# Patient Record
Sex: Male | Born: 1965 | Race: White | Hispanic: No | Marital: Married | Smoking: Former smoker
Health system: Southern US, Community
[De-identification: ages and names within clinical notes are randomized; demographics above are authoritative.]

## PROBLEM LIST (undated history)

## (undated) DIAGNOSIS — K219 Gastro-esophageal reflux disease without esophagitis: Secondary | ICD-10-CM

## (undated) DIAGNOSIS — F419 Anxiety disorder, unspecified: Secondary | ICD-10-CM

## (undated) DIAGNOSIS — E785 Hyperlipidemia, unspecified: Secondary | ICD-10-CM

## (undated) HISTORY — PX: NASAL SINUS SURGERY: SHX719

## (undated) HISTORY — DX: Anxiety disorder, unspecified: F41.9

## (undated) HISTORY — DX: Gastro-esophageal reflux disease without esophagitis: K21.9

## (undated) HISTORY — DX: Hyperlipidemia, unspecified: E78.5

---

## 2014-04-01 DIAGNOSIS — R51 Headache: Secondary | ICD-10-CM

## 2014-04-01 DIAGNOSIS — K219 Gastro-esophageal reflux disease without esophagitis: Secondary | ICD-10-CM | POA: Insufficient documentation

## 2014-04-01 DIAGNOSIS — F419 Anxiety disorder, unspecified: Secondary | ICD-10-CM | POA: Insufficient documentation

## 2014-04-01 DIAGNOSIS — E7800 Pure hypercholesterolemia, unspecified: Secondary | ICD-10-CM | POA: Insufficient documentation

## 2014-04-01 DIAGNOSIS — R519 Headache, unspecified: Secondary | ICD-10-CM | POA: Insufficient documentation

## 2015-07-20 ENCOUNTER — Other Ambulatory Visit: Payer: Self-pay | Admitting: Internal Medicine

## 2015-07-20 ENCOUNTER — Ambulatory Visit
Admission: RE | Admit: 2015-07-20 | Discharge: 2015-07-20 | Disposition: A | Discharge status: Home / Self Care | Payer: BLUE CROSS/BLUE SHIELD | Source: Ambulatory Visit | Attending: Family Medicine | Admitting: Family Medicine

## 2015-07-20 ENCOUNTER — Other Ambulatory Visit: Payer: Self-pay | Admitting: Family Medicine

## 2015-07-20 DIAGNOSIS — N509 Disorder of male genital organs, unspecified: Secondary | ICD-10-CM | POA: Diagnosis present

## 2015-07-20 DIAGNOSIS — N433 Hydrocele, unspecified: Secondary | ICD-10-CM | POA: Diagnosis not present

## 2015-07-20 DIAGNOSIS — N5089 Other specified disorders of the male genital organs: Secondary | ICD-10-CM

## 2015-07-20 DIAGNOSIS — N503 Cyst of epididymis: Secondary | ICD-10-CM | POA: Diagnosis not present

## 2015-07-22 ENCOUNTER — Encounter: Payer: Self-pay | Admitting: Urology

## 2015-07-22 ENCOUNTER — Ambulatory Visit (INDEPENDENT_AMBULATORY_CARE_PROVIDER_SITE_OTHER): Payer: BLUE CROSS/BLUE SHIELD | Admitting: Urology

## 2015-07-22 VITALS — BP 122/81 | HR 67 | Ht 74.0 in | Wt 251.1 lb

## 2015-07-22 DIAGNOSIS — K4091 Unilateral inguinal hernia, without obstruction or gangrene, recurrent: Secondary | ICD-10-CM | POA: Diagnosis not present

## 2015-07-22 DIAGNOSIS — K5900 Constipation, unspecified: Secondary | ICD-10-CM

## 2015-07-22 DIAGNOSIS — N433 Hydrocele, unspecified: Secondary | ICD-10-CM | POA: Diagnosis not present

## 2015-07-22 LAB — URINALYSIS, COMPLETE
BILIRUBIN UA: NEGATIVE
Glucose, UA: NEGATIVE
LEUKOCYTES UA: NEGATIVE
NITRITE UA: NEGATIVE
PH UA: 5.5 (ref 5.0–7.5)
Protein, UA: NEGATIVE
RBC UA: NEGATIVE
Specific Gravity, UA: >1.03 — ABNORMAL HIGH (ref 1.005–1.030)
Urobilinogen, Ur: 0.2 mg/dL (ref 0.2–1.0)

## 2015-07-22 LAB — MICROSCOPIC EXAMINATION

## 2015-07-22 LAB — BLADDER SCAN AMB NON-IMAGING: SCAN RESULT: 27
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
## 2015-07-22 NOTE — Progress Notes (Signed)
Bladder Scan Patient void: 24ml Performed By: K.Nannette Zill,CMA 

## 2015-07-25 ENCOUNTER — Encounter: Payer: Self-pay | Admitting: *Deleted

## 2015-07-25 ENCOUNTER — Encounter: Payer: Self-pay | Admitting: Urology

## 2015-08-04 ENCOUNTER — Encounter: Payer: Self-pay | Admitting: General Surgery

## 2015-08-04 ENCOUNTER — Ambulatory Visit (INDEPENDENT_AMBULATORY_CARE_PROVIDER_SITE_OTHER): Payer: BLUE CROSS/BLUE SHIELD | Admitting: General Surgery

## 2015-08-04 VITALS — BP 140/80 | HR 78 | Resp 14 | Ht 72.0 in | Wt 254.0 lb

## 2015-08-04 DIAGNOSIS — K4090 Unilateral inguinal hernia, without obstruction or gangrene, not specified as recurrent: Secondary | ICD-10-CM | POA: Diagnosis not present

## 2015-08-04 NOTE — Patient Instructions (Signed)
Hernia, Adult A hernia is the bulging of an organ or tissue through a weak spot in the muscles of the abdomen (abdominal wall). Hernias develop most often near the navel or groin. There are many kinds of hernias. Common kinds include:  Femoral hernia. This kind of hernia develops under the groin in the upper thigh area.  Inguinal hernia. This kind of hernia develops in the groin or scrotum.  Umbilical hernia. This kind of hernia develops near the navel.  Hiatal hernia. This kind of hernia causes part of the stomach to be pushed up into the chest.  Incisional hernia. This kind of hernia bulges through a scar from an abdominal surgery. CAUSES This condition may be caused by:  Heavy lifting.  Coughing over a long period of time.  Straining to have a bowel movement.  An incision made during an abdominal surgery.  A birth defect (congenital defect).  Excess weight or obesity.  Smoking.  Poor nutrition.  Cystic fibrosis.  Excess fluid in the abdomen.  Undescended testicles. SYMPTOMS Symptoms of a hernia include:  A lump on the abdomen. This is the first sign of a hernia. The lump may become more obvious with standing, straining, or coughing. It may get bigger over time if it is not treated or if the condition causing it is not treated.  Pain. A hernia is usually painless, but it may become painful over time if treatment is delayed. The pain is usually dull and may get worse with standing or lifting heavy objects. Sometimes a hernia gets tightly squeezed in the weak spot (strangulated) or stuck there (incarcerated) and causes additional symptoms. These symptoms may include:  Vomiting.  Nausea.  Constipation.  Irritability. DIAGNOSIS A hernia may be diagnosed with:  A physical exam. During the exam your health care provider may ask you to cough or to make a specific movement, because a hernia is usually more visible when you move.  Imaging tests. These can  include:  X-rays.  Ultrasound.  CT scan. TREATMENT A hernia that is small and painless may not need to be treated. A hernia that is large or painful may be treated with surgery. Inguinal hernias may be treated with surgery to prevent incarceration or strangulation. Strangulated hernias are always treated with surgery, because lack of blood to the trapped organ or tissue can cause it to die. Surgery to treat a hernia involves pushing the bulge back into place and repairing the weak part of the abdomen. HOME CARE INSTRUCTIONS  Avoid straining.  Do not lift anything heavier than 10 lb (4.5 kg).  Lift with your leg muscles, not your back muscles. This helps avoid strain.  When coughing, try to cough gently.  Prevent constipation. Constipation leads to straining with bowel movements, which can make a hernia worse or cause a hernia repair to break down. You can prevent constipation by:  Eating a high-fiber diet that includes plenty of fruits and vegetables.  Drinking enough fluids to keep your urine clear or pale yellow. Aim to drink 6-8 glasses of water per day.  Using a stool softener as directed by your health care provider.  Lose weight, if you are overweight.  Do not use any tobacco products, including cigarettes, chewing tobacco, or electronic cigarettes. If you need help quitting, ask your health care provider.  Keep all follow-up visits as directed by your health care provider. This is important. Your health care provider may need to monitor your condition. SEEK MEDICAL CARE IF:  You have   swelling, redness, and pain in the affected area.  Your bowel habits change. SEEK IMMEDIATE MEDICAL CARE IF:  You have a fever.  You have abdominal pain that is getting worse.  You feel nauseous or you vomit.  You cannot push the hernia back in place by gently pressing on it while you are lying down.  The hernia:  Changes in shape or size.  Is stuck outside the  abdomen.  Becomes discolored.  Feels hard or tender.   This information is not intended to replace advice given to you by your health care provider. Make sure you discuss any questions you have with your health care provider.   Document Released: 04/09/2005 Document Revised: 04/30/2014 Document Reviewed: 02/17/2014 Elsevier Interactive Patient Education 2016 [employer]  

## 2015-08-04 NOTE — Progress Notes (Signed)
Patient ID: Jared Gross, male   DOB: Jan 13, 1966, 50 y.o.   MRN: [id number]  Chief Complaint  Patient presents with  . Other    right inguinal hernia    HPI Jared Gross is a 50 y.o. male here today for a evaluation of a right inguinal hernia. The patient recently underwent evaluation with the urology service for left testicular swelling. As part of that evaluation ultrasound was completed. The patient was not aware of any abnormalities in the right groin prior to his recent urology visit.   HPI  Past Medical History  Diagnosis Date  . Acid reflux   . Anxiety   . Hyperlipidemia     Past Surgical History  Procedure Laterality Date  . Nasal sinus surgery      Family History  Problem Relation Age of Onset  . Hematuria Neg Hx   . Kidney cancer Neg Hx     Social History Social History  Substance Use Topics  . Smoking status: Former Smoker    Quit date: 07/22/1995  . Smokeless tobacco: None  . Alcohol Use: 0.0 oz/week    0 Standard drinks or equivalent per week    Allergies  Allergen Reactions  . Hydrocodone-Acetaminophen Nausea And Vomiting    Current Outpatient Prescriptions  Medication Sig Dispense Refill  . simvastatin (ZOCOR) 10 MG tablet Take by mouth. Reported on 08/04/2015     No current facility-administered medications for this visit.    Review of Systems Review of Systems  Constitutional: Negative.   Respiratory: Negative.   Cardiovascular: Negative.     Blood pressure 140/80, pulse 78, resp. rate 14, height 6' (1.829 m), weight 254 lb (115.214 kg).  Physical Exam Physical Exam  Constitutional: He is oriented to person, place, and time. He appears well-developed and well-nourished.  Eyes: Conjunctivae are normal. No scleral icterus.  Neck: Neck supple.  Cardiovascular: Normal rate, regular rhythm and normal heart sounds.   Pulmonary/Chest: Effort normal and breath sounds normal.  Abdominal: Soft. Normal appearance and bowel sounds are normal.  There is no tenderness. A hernia is present. Hernia confirmed positive in the right inguinal area.  Genitourinary:    Left testis shows swelling (the left hemiscrotum is proximally twice as large as the right.).  Lymphadenopathy:    He has no cervical adenopathy.  Neurological: He is alert and oriented to person, place, and time.  Skin: Skin is warm and dry.    Data Reviewed CLINICAL DATA: Swelling of the left testicle for 1 week. Evaluate for groin hernia.  EXAM: ULTRASOUND PELVIS LIMITED  TECHNIQUE: Ultrasound examination of the LEFT GROIN was performed in the area of clinical concern.  COMPARISON: None  FINDINGS: Left groin grayscale imaging with Valsalva shows mobile appearing fat which could reflect a fatty inguinal hernia. No bowel seen in the groin. Dedicated imaging of the scrotal contents reported separately.  IMPRESSION: Mobile fat in the left groin may reflect fatty inguinal hernia. No evidence of herniated bowel.  CLINICAL DATA: Left testicular swelling  EXAM: SCROTAL ULTRASOUND  DOPPLER ULTRASOUND OF THE TESTICLES  TECHNIQUE: Complete ultrasound examination of the testicles, epididymis, and other scrotal structures was performed. Color and spectral Doppler ultrasound were also utilized to evaluate blood flow to the testicles.  COMPARISON: None.  FINDINGS: Right testicle  Measurements: 3.3 x 2.3 x 3.6 cm. No mass or microlithiasis visualized.  Left testicle  Measurements: 4.7 x 2.6 x 3.2 cm. No mass or microlithiasis visualized.  Right epididymis: Small complex cyst measuring  3 mm is noted.  Left epididymis: Normal in size and appearance.  Hydrocele: Small bilateral hydroceles.  Varicocele: Mild varicoceles are noted bilaterally as well.  Pulsed Doppler interrogation of both testes demonstrates normal low resistance arterial and venous waveforms bilaterally.  IMPRESSION: The testicles appear within normal  limits.  Small right epididymal cyst.  Bilateral hydroceles and varicoceles.   Electronically Signed  By: Inez Catalina M.D.  On: 07/20/2015 14:32  Urology evaluation with Nicolette Bang, M.D. from [facility] dated 07/22/2015 reviewed. History obtained in that office reports traumatic injury to the left scrotum one week prior to evaluation and impression is of a traumatic hydrocele not requiring operative intervention. No significant asymmetry described during that exam.  Assessment    Right inguinal hernia.  Left hemiscrotum swelling    Plan    The patient will benefit from repeat urology assessment considering the asymmetry in the hemiscrotum noticed on today's exam and the patient's reported increasing discomfort since his urology assessment. The ultrasound finding of multiple fat within the inguinal canal was not enough to warrant operative intervention on the left side.      Hernia precautions and incarceration were discussed with the patient. If they develop symptoms of an incarcerated hernia, they were encouraged to seek prompt medical attention.  I have recommended repair of the hernia using mesh on an outpatient basis in the near future. The risk of infection was reviewed. The role of prosthetic mesh to minimize the risk of recurrence was reviewed.  PCP:  Sherrin Daisy This information has been scribed by Gaspar Cola CMA.    Robert Bellow 08/05/2015, 5:24 PM

## 2015-08-05 DIAGNOSIS — K4090 Unilateral inguinal hernia, without obstruction or gangrene, not specified as recurrent: Secondary | ICD-10-CM | POA: Insufficient documentation

## 2015-08-08 ENCOUNTER — Telehealth: Payer: Self-pay | Admitting: *Deleted

## 2015-08-08 NOTE — Telephone Encounter (Signed)
I spoke with Amy, the surgery scheduler for [place], she states that patient presently has an appointment with Dr. Noah Delaine for 08-19-15 at 11 am.   Patient is aware of appointment and will keep as scheduled. We will await to here from [place] before scheduling a surgery date.

## 2015-08-08 NOTE — Telephone Encounter (Signed)
-----   Message from Robert Bellow, MD sent at 08/05/2015  5:26 PM EDT ----- Looking at the note from urology unknown see that they were planning on any surgical intervention. I think the patient needs to be reassessed by Dr. Noah Delaine prior to our involvement with repair of the contralateral hernia considering the patient's increasing discomfort and swelling. Thank you

## 2015-08-19 ENCOUNTER — Ambulatory Visit (INDEPENDENT_AMBULATORY_CARE_PROVIDER_SITE_OTHER): Payer: BLUE CROSS/BLUE SHIELD | Admitting: Urology

## 2015-08-19 VITALS — BP 125/81 | HR 76 | Resp 16 | Wt 255.0 lb

## 2015-08-19 DIAGNOSIS — N433 Hydrocele, unspecified: Secondary | ICD-10-CM | POA: Diagnosis not present

## 2015-08-19 LAB — URINALYSIS, COMPLETE
Bilirubin, UA: NEGATIVE
GLUCOSE, UA: NEGATIVE
Ketones, UA: NEGATIVE
Leukocytes, UA: NEGATIVE
Nitrite, UA: NEGATIVE
PROTEIN UA: NEGATIVE
RBC, UA: NEGATIVE
Specific Gravity, UA: 1.025 (ref 1.005–1.030)
Urobilinogen, Ur: 0.2 mg/dL (ref 0.2–1.0)
pH, UA: 5.5 (ref 5.0–7.5)

## 2015-08-19 LAB — MICROSCOPIC EXAMINATION
BACTERIA UA: NONE SEEN
EPITHELIAL CELLS (NON RENAL): NONE SEEN /HPF (ref 0–10)

## 2015-08-19 NOTE — Progress Notes (Signed)
08/19/2015 11:13 AM   Jared Gross [birth year] [id number]  Referring provider: Sherrin Daisy, MD [place], [place] [id number]  Chief Complaint  Patient presents with  . Hydrocele    HPI: Jared Gross is a 50yo here for followup for LUTS and left hydrocele. He has mild left testicular pain which is worse with lifting. He has seen general surgery and is pursuing right inguinal hernia repair.  He continues to nocturia 2-3x, mild urgency which does not bother him. He drinks green tea prior to bedtime.  No OSA   PMH: Past Medical History  Diagnosis Date  . Acid reflux   . Anxiety   . Hyperlipidemia     Surgical History: Past Surgical History  Procedure Laterality Date  . Nasal sinus surgery      Home Medications:    Medication List       This list is accurate as of: 08/19/15 11:13 AM.  Always use your most recent med list.               simvastatin 10 MG tablet  Commonly known as:  ZOCOR  Take by mouth. Reported on 08/19/2015        Allergies:  Allergies  Allergen Reactions  . Hydrocodone-Acetaminophen Nausea And Vomiting    Family History: Family History  Problem Relation Age of Onset  . Hematuria Neg Hx   . Kidney cancer Neg Hx     Social History:  reports that he quit smoking about 20 years ago. He does not have any smokeless tobacco history on file. He reports that he drinks alcohol. He reports that he does not use illicit drugs.  ROS: UROLOGY Frequent Urination?: Yes Hard to postpone urination?: Yes Burning/pain with urination?: No Get up at night to urinate?: Yes Leakage of urine?: No Urine stream starts and stops?: No Trouble starting stream?: No Do you have to strain to urinate?: No Blood in urine?: No Urinary tract infection?: No Sexually transmitted disease?: No Injury to kidneys or bladder?: No Painful intercourse?: No Weak stream?: No Erection problems?: No Penile pain?: No  Gastrointestinal Nausea?:  No Vomiting?: No Indigestion/heartburn?: No Diarrhea?: No Constipation?: No  Constitutional Fever: No Night sweats?: No Weight loss?: No Fatigue?: No  Skin Skin rash/lesions?: No Itching?: No  Eyes Blurred vision?: No Double vision?: No  Ears/Nose/Throat Sore throat?: No Sinus problems?: No  Hematologic/Lymphatic Swollen glands?: No Easy bruising?: No  Cardiovascular Leg swelling?: No Chest pain?: No  Respiratory Cough?: No Shortness of breath?: No  Endocrine Excessive thirst?: No  Musculoskeletal Back pain?: No Joint pain?: No  Neurological Headaches?: No Dizziness?: No  Psychologic Depression?: No Anxiety?: No  Physical Exam: BP 125/81 mmHg  Pulse 76  Resp 16  Wt 115.667 kg (255 lb)  Constitutional:  Alert and oriented, No acute distress. HEENT: [place] AT, moist mucus membranes.  Trachea midline, no masses. Cardiovascular: No clubbing, cyanosis, or edema. Respiratory: Normal respiratory effort, no increased work of breathing. GI: Abdomen is soft, nontender, nondistended, no abdominal masses GU: No CVA tenderness.  Skin: No rashes, bruises or suspicious lesions. Lymph: No cervical or inguinal adenopathy. Neurologic: Grossly intact, no focal deficits, moving all 4 extremities. Psychiatric: Normal mood and affect.  Laboratory Data: No results found for: WBC, HGB, HCT, MCV, PLT  No results found for: CREATININE  No results found for: PSA  No results found for: TESTOSTERONE  No results found for: HGBA1C  Urinalysis    Component Value Date/Time   APPEARANCEUR  Cloudy* 07/22/2015 1142   GLUCOSEU Negative 07/22/2015 1142   BILIRUBINUR Negative 07/22/2015 1142   PROTEINUR Negative 07/22/2015 1142   NITRITE Negative 07/22/2015 1142   LEUKOCYTESUR Negative 07/22/2015 1142    Pertinent Imaging: scrotal US  Assessment & Plan:    1. Hydrocele in adult -schedule for left hydrocelectomy in combination with right inguinal hernia repair. -  Urinalysis, Complete  2. BPH with LUTS -Pt defers BPH treatment at this time   No Follow-up on file.  Nicolette Bang, MD  [facility] [address] [place], [place] [address] [phone]

## 2015-08-25 ENCOUNTER — Telehealth: Payer: Self-pay | Admitting: Radiology

## 2015-08-25 NOTE — Telephone Encounter (Signed)
No answer & unable to leave message. Need to notify pt of surgery scheduled 10/14/15.

## 2015-08-30 NOTE — Telephone Encounter (Signed)
LMOM

## 2015-09-01 NOTE — Telephone Encounter (Signed)
LMOM

## 2015-09-26 ENCOUNTER — Encounter: Payer: Self-pay | Admitting: *Deleted

## 2015-10-03 ENCOUNTER — Encounter: Payer: Self-pay | Admitting: General Surgery

## 2015-10-03 ENCOUNTER — Ambulatory Visit (INDEPENDENT_AMBULATORY_CARE_PROVIDER_SITE_OTHER): Payer: BLUE CROSS/BLUE SHIELD | Admitting: General Surgery

## 2015-10-03 VITALS — BP 124/76 | HR 76 | Resp 12 | Ht 72.0 in | Wt 252.0 lb

## 2015-10-03 DIAGNOSIS — K4090 Unilateral inguinal hernia, without obstruction or gangrene, not specified as recurrent: Secondary | ICD-10-CM

## 2015-10-03 NOTE — Patient Instructions (Signed)

## 2015-10-03 NOTE — Progress Notes (Signed)
Patient ID: Jared Gross, male   DOB: [birth year], 50 y.o.   MRN: [id number]  Chief Complaint  Patient presents with  . Pre-op Exam    right inguinal hernia    HPI Jared Gross is a 50 y.o. male here today for his pre op right inguinal hernia repair scheduled for 10/14/15. A left hydrocele will be resected at the same setting by the urology service.  I personally reviewed the patient's history. HPI  Past Medical History  Diagnosis Date  . Acid reflux   . Anxiety   . Hyperlipidemia     Past Surgical History  Procedure Laterality Date  . Nasal sinus surgery      Family History  Problem Relation Age of Onset  . Hematuria Neg Hx   . Kidney cancer Neg Hx     Social History Social History  Substance Use Topics  . Smoking status: Former Smoker    Quit date: 07/22/1995  . Smokeless tobacco: None  . Alcohol Use: 0.0 oz/week    0 Standard drinks or equivalent per week    Allergies  Allergen Reactions  . Hydrocodone-Acetaminophen Nausea And Vomiting    Current Outpatient Prescriptions  Medication Sig Dispense Refill  . Aspirin-Salicylamide-Caffeine (ARTHRITIS STRENGTH BC POWDER PO) Take 1 packet by mouth daily.     No current facility-administered medications for this visit.    Review of Systems Review of Systems  Constitutional: Negative.   Respiratory: Negative.   Cardiovascular: Negative.     Blood pressure 124/76, pulse 76, resp. rate 12, height 6' (1.829 m), weight 252 lb (114.306 kg).  Physical Exam Physical Exam  Constitutional: He is oriented to person, place, and time. He appears well-developed and well-nourished.  Eyes: Conjunctivae are normal. No scleral icterus.  Neck: Neck supple.  Cardiovascular: Normal rate, regular rhythm and normal heart sounds.   Pulmonary/Chest: Effort normal and breath sounds normal.  Abdominal: Soft. Bowel sounds are normal. There is no hepatomegaly. There is no tenderness. A hernia is present. Hernia confirmed positive  in the right inguinal area.  Genitourinary:     Lymphadenopathy:    He has no cervical adenopathy.  Neurological: He is alert and oriented to person, place, and time.  Skin: Skin is warm and dry.      Assessment     Symptomatic right inguinal hernia.     Plan    The patient has been encouraged to get a scrotal support or jockstrap for postoperative comfort. He has been advised by the urologist that a drain will be placed at the time of surgery.    Hernia precautions and incarceration were discussed with the patient. If they develop symptoms of an incarcerated hernia, they were encouraged to seek prompt medical attention.  I have recommended repair of the hernia using mesh on an outpatient basis in the near future. The risk of infection was reviewed. The role of prosthetic mesh to minimize the risk of recurrence was reviewed.  PCP:  Jared Gross  This information has been scribed by Jared Gross CMA.    Jared Gross 10/03/2015, 8:26 PM

## 2015-10-03 NOTE — H&P (Signed)
HPI  Jared Gross is a 50 y.o. male here today for his pre op right inguinal hernia repair scheduled for 10/14/15. A left hydrocele will be resected at the same setting by the urology service.  I personally reviewed the patient's history.  HPI  Past Medical History   Diagnosis  Date   .  Acid reflux    .  Anxiety    .  Hyperlipidemia     Past Surgical History   Procedure  Laterality  Date   .  Nasal sinus surgery      Family History   Problem  Relation  Age of Onset   .  Hematuria  Neg Hx    .  Kidney cancer  Neg Hx     Social History  Social History   Substance Use Topics   .  Smoking status:  Former Smoker     Quit date:  07/22/1995   .  Smokeless tobacco:  None   .  Alcohol Use:  0.0 oz/week     0 Standard drinks or equivalent per week    Allergies   Allergen  Reactions   .  Hydrocodone-Acetaminophen  Nausea And Vomiting    Current Outpatient Prescriptions   Medication  Sig  Dispense  Refill   .  Aspirin-Salicylamide-Caffeine (ARTHRITIS STRENGTH BC POWDER PO)  Take 1 packet by mouth daily.      No current facility-administered medications for this visit.    Review of Systems  Review of Systems  Constitutional: Negative.  Respiratory: Negative.  Cardiovascular: Negative.   Blood pressure 124/76, pulse 76, resp. rate 12, height 6' (1.829 m), weight 252 lb (114.306 kg).  Physical Exam  Physical Exam  Constitutional: He is oriented to person, place, and time. He appears well-developed and well-nourished.  Eyes: Conjunctivae are normal. No scleral icterus.  Neck: Neck supple.  Cardiovascular: Normal rate, regular rhythm and normal heart sounds.  Pulmonary/Chest: Effort normal and breath sounds normal.  Abdominal: Soft. Bowel sounds are normal. There is no hepatomegaly. There is no tenderness. A hernia is present. Hernia confirmed positive in the right inguinal area.  Genitourinary:     Lymphadenopathy:  He has no cervical adenopathy.  Neurological: He is  alert and oriented to person, place, and time.  Skin: Skin is warm and dry.   Assessment   Symptomatic right inguinal hernia.   Plan   The patient has been encouraged to get a scrotal support or jockstrap for postoperative comfort. He has been advised by the urologist that a drain will be placed at the time of surgery.   Hernia precautions and incarceration were discussed with the patient. If they develop symptoms of an incarcerated hernia, they were encouraged to seek prompt medical attention.  I have recommended repair of the hernia using mesh on an outpatient basis in the near future. The risk of infection was reviewed. The role of prosthetic mesh to minimize the risk of recurrence was reviewed.  PCP: Sherrin Daisy  This information has been scribed by Gaspar Cola CMA.  Robert Bellow  10/03/2015, 8:26 PM

## 2015-10-06 ENCOUNTER — Encounter: Payer: Self-pay | Admitting: *Deleted

## 2015-10-06 ENCOUNTER — Inpatient Hospital Stay: Admission: RE | Admit: 2015-10-06 | Payer: BLUE CROSS/BLUE SHIELD | Source: Ambulatory Visit

## 2015-10-06 NOTE — Patient Instructions (Signed)
  Your procedure is scheduled on: 10/14/15 Report to Day Surgery. [facility]OND [facility]LO[facility] To find out your arrival time please call [phone] between 1PM - 3PM on 10/13/15.  Remember: Instructions that are not followed completely may result in serious medical risk, up to and including death, or upon the discretion of your surgeon and anesthesiologist your surgery may need to be rescheduled.    _X___ 1. Do not eat food or drink liquids after midnight. No gum chewing or hard candies.     _X___ 2. No Alcohol for 24 hours before or after surgery.   __X__ 3. Do Not Smoke For 24 Hours Prior to Your Surgery.   ____ 4. Bring all medications with you on the day of surgery if instructed.    _X___ 5. Notify your doctor if there is any change in your medical condition     (cold, fever, infections).       Do not wear jewelry, make-up, hairpins, clips or nail polish.  Do not wear lotions, powders, or perfumes. You may wear deodorant.  Do not shave 48 hours prior to surgery. Men may shave face and neck.  Do not bring valuables to the hospital.    [facility] is not responsible for any belongings or valuables.               Contacts, dentures or bridgework may not be worn into surgery.  Leave your suitcase in the car. After surgery it may be brought to your room.  For patients admitted to the hospital, discharge time is determined by your                treatment team.   Patients discharged the day of surgery will not be allowed to drive home.   Please read over the following fact sheets that you were given:   Surgical Site Infection Prevention   ____ Take these medicines the morning of surgery with A SIP OF WATER:    1. NONE  2.   3.   4.  5.  6.  ____ Fleet Enema (as directed)   ____ Use CHG Soap as directed  ____ Use inhalers on the day of surgery  ____ Stop metformin 2 days prior to surgery    ____ Take 1/2 of usual insulin dose the night before surgery and none on the  morning of surgery.   __X__ Stop Coumadin/Plavix/aspirin on   STOP ASPIRIN 10/06/15  ____ Stop Anti-inflammatories on    ____ Stop supplements until after surgery.    ____ Bring C-Pap to the hospital.

## 2015-10-14 ENCOUNTER — Telehealth: Payer: Self-pay | Admitting: General Surgery

## 2015-10-14 ENCOUNTER — Encounter
Admission: RE | Disposition: A | Discharge status: Home / Self Care | Payer: Self-pay | Source: Ambulatory Visit | Attending: General Surgery

## 2015-10-14 ENCOUNTER — Ambulatory Visit
Admission: RE | Admit: 2015-10-14 | Discharge: 2015-10-14 | Disposition: A | Discharge status: Home / Self Care | Payer: BLUE CROSS/BLUE SHIELD | Source: Ambulatory Visit | Attending: General Surgery | Admitting: General Surgery

## 2015-10-14 ENCOUNTER — Encounter: Payer: Self-pay | Admitting: *Deleted

## 2015-10-14 ENCOUNTER — Ambulatory Visit: Payer: BLUE CROSS/BLUE SHIELD | Admitting: Anesthesiology

## 2015-10-14 DIAGNOSIS — K219 Gastro-esophageal reflux disease without esophagitis: Secondary | ICD-10-CM | POA: Insufficient documentation

## 2015-10-14 DIAGNOSIS — Z87891 Personal history of nicotine dependence: Secondary | ICD-10-CM | POA: Diagnosis not present

## 2015-10-14 DIAGNOSIS — F419 Anxiety disorder, unspecified: Secondary | ICD-10-CM | POA: Diagnosis not present

## 2015-10-14 DIAGNOSIS — N432 Other hydrocele: Secondary | ICD-10-CM | POA: Insufficient documentation

## 2015-10-14 DIAGNOSIS — E669 Obesity, unspecified: Secondary | ICD-10-CM | POA: Insufficient documentation

## 2015-10-14 DIAGNOSIS — K4090 Unilateral inguinal hernia, without obstruction or gangrene, not specified as recurrent: Secondary | ICD-10-CM | POA: Diagnosis not present

## 2015-10-14 DIAGNOSIS — D176 Benign lipomatous neoplasm of spermatic cord: Secondary | ICD-10-CM | POA: Insufficient documentation

## 2015-10-14 DIAGNOSIS — Z7982 Long term (current) use of aspirin: Secondary | ICD-10-CM | POA: Diagnosis not present

## 2015-10-14 DIAGNOSIS — E785 Hyperlipidemia, unspecified: Secondary | ICD-10-CM | POA: Insufficient documentation

## 2015-10-14 DIAGNOSIS — N433 Hydrocele, unspecified: Secondary | ICD-10-CM | POA: Diagnosis not present

## 2015-10-14 HISTORY — PX: HYDROCELE EXCISION: SHX482

## 2015-10-14 HISTORY — PX: INGUINAL HERNIA REPAIR: SHX194

## 2015-10-14 HISTORY — PX: HERNIA REPAIR: SHX51

## 2015-10-14 SURGERY — REPAIR, HERNIA, INGUINAL, ADULT
Anesthesia: General | Laterality: Right | Wound class: Clean Contaminated

## 2015-10-14 MED ORDER — HYDROMORPHONE HCL 1 MG/ML IJ SOLN
0.2500 mg | INTRAMUSCULAR | Status: DC | PRN
  Administered 2015-10-14: 0.25 mg via INTRAVENOUS

## 2015-10-14 MED ORDER — FENTANYL CITRATE (PF) 100 MCG/2ML IJ SOLN
25.0000 ug | INTRAMUSCULAR | Status: DC | PRN
  Administered 2015-10-14 (×4): 25 ug via INTRAVENOUS

## 2015-10-14 MED ORDER — FENTANYL CITRATE (PF) 100 MCG/2ML IJ SOLN
INTRAMUSCULAR | Status: DC | PRN
  Administered 2015-10-14 (×4): 50 ug via INTRAVENOUS

## 2015-10-14 MED ORDER — LIDOCAINE HCL (PF) 1 % IJ SOLN
INTRAMUSCULAR | Status: AC
  Filled 2015-10-14: qty 30

## 2015-10-14 MED ORDER — ONDANSETRON HCL 4 MG/2ML IJ SOLN
INTRAMUSCULAR | Status: DC | PRN
  Administered 2015-10-14: 4 mg via INTRAVENOUS

## 2015-10-14 MED ORDER — FAMOTIDINE 20 MG PO TABS
ORAL_TABLET | ORAL | Status: AC
  Filled 2015-10-14: qty 1

## 2015-10-14 MED ORDER — CEFAZOLIN SODIUM-DEXTROSE 2-4 GM/100ML-% IV SOLN
INTRAVENOUS | Status: AC
  Filled 2015-10-14: qty 100

## 2015-10-14 MED ORDER — OXYCODONE-ACETAMINOPHEN 5-325 MG PO TABS: 1.0000 | ORAL_TABLET | ORAL | Status: DC | PRN

## 2015-10-14 MED ORDER — DEXAMETHASONE SODIUM PHOSPHATE 10 MG/ML IJ SOLN
INTRAMUSCULAR | Status: DC | PRN
  Administered 2015-10-14: 10 mg via INTRAVENOUS

## 2015-10-14 MED ORDER — BUPIVACAINE-EPINEPHRINE (PF) 0.5% -1:200000 IJ SOLN
INTRAMUSCULAR | Status: DC | PRN
  Administered 2015-10-14: 30 mL via PERINEURAL

## 2015-10-14 MED ORDER — LIDOCAINE HCL (CARDIAC) 20 MG/ML IV SOLN
INTRAVENOUS | Status: DC | PRN
  Administered 2015-10-14: 40 mg via INTRAVENOUS

## 2015-10-14 MED ORDER — KETOROLAC TROMETHAMINE 30 MG/ML IJ SOLN
INTRAMUSCULAR | Status: DC | PRN
  Administered 2015-10-14: 30 mg via INTRAVENOUS

## 2015-10-14 MED ORDER — FENTANYL CITRATE (PF) 100 MCG/2ML IJ SOLN
INTRAMUSCULAR | Status: AC
  Administered 2015-10-14: 25 ug via INTRAVENOUS
  Filled 2015-10-14: qty 2

## 2015-10-14 MED ORDER — PHENYLEPHRINE HCL 10 MG/ML IJ SOLN
INTRAMUSCULAR | Status: DC | PRN
  Administered 2015-10-14 (×3): 100 ug via INTRAVENOUS

## 2015-10-14 MED ORDER — FAMOTIDINE 20 MG PO TABS
20.0000 mg | ORAL_TABLET | Freq: Once | ORAL | Status: AC
  Administered 2015-10-14: 20 mg via ORAL

## 2015-10-14 MED ORDER — BUPIVACAINE-EPINEPHRINE (PF) 0.5% -1:200000 IJ SOLN
INTRAMUSCULAR | Status: AC
  Filled 2015-10-14: qty 30

## 2015-10-14 MED ORDER — GLYCOPYRROLATE 0.2 MG/ML IJ SOLN
INTRAMUSCULAR | Status: DC | PRN
  Administered 2015-10-14: 0.2 mg via INTRAVENOUS

## 2015-10-14 MED ORDER — ONDANSETRON HCL 4 MG/2ML IJ SOLN
INTRAMUSCULAR | Status: AC
  Filled 2015-10-14: qty 2

## 2015-10-14 MED ORDER — PROPOFOL 10 MG/ML IV BOLUS
INTRAVENOUS | Status: DC | PRN
  Administered 2015-10-14: 200 mg via INTRAVENOUS

## 2015-10-14 MED ORDER — HYDROMORPHONE HCL 1 MG/ML IJ SOLN
INTRAMUSCULAR | Status: AC
  Administered 2015-10-14: 0.25 mg via INTRAVENOUS
  Filled 2015-10-14: qty 1

## 2015-10-14 MED ORDER — ONDANSETRON HCL 4 MG/2ML IJ SOLN
4.0000 mg | Freq: Once | INTRAMUSCULAR | Status: AC | PRN
  Administered 2015-10-14: 4 mg via INTRAVENOUS

## 2015-10-14 MED ORDER — PROMETHAZINE HCL 12.5 MG PO TABS: 12.5000 mg | ORAL_TABLET | ORAL | Status: DC | PRN

## 2015-10-14 MED ORDER — CEFAZOLIN SODIUM-DEXTROSE 2-4 GM/100ML-% IV SOLN
2.0000 g | INTRAVENOUS | Status: AC
Start: 2015-10-14 — End: 2015-10-14
  Administered 2015-10-14: 2 g via INTRAVENOUS

## 2015-10-14 MED ORDER — MIDAZOLAM HCL 2 MG/2ML IJ SOLN
INTRAMUSCULAR | Status: AC
  Filled 2015-10-14: qty 2

## 2015-10-14 MED ORDER — MIDAZOLAM HCL 2 MG/2ML IJ SOLN
INTRAMUSCULAR | Status: DC | PRN
Start: 2015-10-14 — End: 2015-10-14
  Administered 2015-10-14: 2 mg via INTRAVENOUS

## 2015-10-14 MED ORDER — MIDAZOLAM HCL 2 MG/2ML IJ SOLN
1.0000 mg | Freq: Once | INTRAMUSCULAR | Status: AC
  Administered 2015-10-14: 1 mg via INTRAVENOUS

## 2015-10-14 MED ORDER — LACTATED RINGERS IV SOLN
INTRAVENOUS | Status: DC
  Administered 2015-10-14 (×2): via INTRAVENOUS

## 2015-10-14 SURGICAL SUPPLY — 56 items
BLADE SURG 15 STRL SS SAFETY (BLADE) ×16 IMPLANT
CANISTER SUCT 1200ML W/VALVE (MISCELLANEOUS) ×8 IMPLANT
CHLORAPREP W/TINT 26ML (MISCELLANEOUS) ×8 IMPLANT
CLOSURE WOUND 1/2 X4 (GAUZE/BANDAGES/DRESSINGS) ×1
CUP MEDICINE 2OZ PLAST GRAD ST (MISCELLANEOUS) ×4 IMPLANT
DECANTER SPIKE VIAL GLASS SM (MISCELLANEOUS) ×4 IMPLANT
DRAIN PENROSE 1/4X12 LTX (DRAIN) ×8 IMPLANT
DRAPE LAPAROTOMY 100X77 ABD (DRAPES) ×4 IMPLANT
DRAPE LAPAROTOMY 77X122 PED (DRAPES) ×4 IMPLANT
DRESSING TELFA 4X3 1S ST N-ADH (GAUZE/BANDAGES/DRESSINGS) ×4 IMPLANT
DRSG TEGADERM 4X4.75 (GAUZE/BANDAGES/DRESSINGS) ×4 IMPLANT
ELECT REM PT RETURN 9FT ADLT (ELECTROSURGICAL) ×8
ELECTRODE REM PT RTRN 9FT ADLT (ELECTROSURGICAL) ×4 IMPLANT
GAUZE FLUFF 18X24 1PLY STRL (GAUZE/BANDAGES/DRESSINGS) ×4 IMPLANT
GAUZE SPONGE 4X4 12PLY STRL (GAUZE/BANDAGES/DRESSINGS) ×4 IMPLANT
GAUZE STRETCH 2X75IN STRL (MISCELLANEOUS) ×4 IMPLANT
GLOVE BIO SURGEON STRL SZ 6.5 (GLOVE) ×3 IMPLANT
GLOVE BIO SURGEON STRL SZ7 (GLOVE) ×4 IMPLANT
GLOVE BIO SURGEON STRL SZ7.5 (GLOVE) ×4 IMPLANT
GLOVE BIO SURGEONS STRL SZ 6.5 (GLOVE) ×1
GLOVE INDICATOR 8.0 STRL GRN (GLOVE) ×4 IMPLANT
GOWN STRL REUS W/ TWL LRG LVL3 (GOWN DISPOSABLE) ×8 IMPLANT
GOWN STRL REUS W/TWL LRG LVL3 (GOWN DISPOSABLE) ×8
KIT RM TURNOVER STRD PROC AR (KITS) ×8 IMPLANT
LABEL OR SOLS (LABEL) ×8 IMPLANT
LIQUID BAND (GAUZE/BANDAGES/DRESSINGS) ×4 IMPLANT
MESH HERNIA 1.6X1.9 PLUG LRG (Mesh General) ×2 IMPLANT
MESH HERNIA PLUG LRG (Mesh General) ×2 IMPLANT
NDL SAFETY 22GX1.5 (NEEDLE) ×8 IMPLANT
NEEDLE HYPO 25X1 1.5 SAFETY (NEEDLE) ×8 IMPLANT
NS IRRIG 500ML POUR BTL (IV SOLUTION) ×4 IMPLANT
PACK BASIN MINOR ARMC (MISCELLANEOUS) ×8 IMPLANT
SOL PREP PVP 2OZ (MISCELLANEOUS) ×4
SOLUTION PREP PVP 2OZ (MISCELLANEOUS) ×2 IMPLANT
STRIP CLOSURE SKIN 1/2X4 (GAUZE/BANDAGES/DRESSINGS) ×3 IMPLANT
SUPPORETR ATHLETIC LG (MISCELLANEOUS) ×2 IMPLANT
SUPPORT SCROTAL LG STRP (MISCELLANEOUS) ×3 IMPLANT
SUPPORTER ATHLETIC LG (MISCELLANEOUS) ×5
SUT CHROMIC 3 0 SH 27 (SUTURE) ×8 IMPLANT
SUT ETHILON 3-0 FS-10 30 BLK (SUTURE) ×8
SUT MNCRL AB 4-0 PS2 18 (SUTURE) ×4 IMPLANT
SUT SURGILON 0 BLK (SUTURE) ×8 IMPLANT
SUT VIC AB 2-0 SH 27 (SUTURE) ×2
SUT VIC AB 2-0 SH 27XBRD (SUTURE) ×2 IMPLANT
SUT VIC AB 3-0 54X BRD REEL (SUTURE) ×2 IMPLANT
SUT VIC AB 3-0 BRD 54 (SUTURE) ×2
SUT VIC AB 3-0 SH 27 (SUTURE) ×8
SUT VIC AB 3-0 SH 27X BRD (SUTURE) ×8 IMPLANT
SUT VIC AB 4-0 FS2 27 (SUTURE) ×4 IMPLANT
SUT VIC AB 4-0 SH 27 (SUTURE) ×2
SUT VIC AB 4-0 SH 27XANBCTRL (SUTURE) ×2 IMPLANT
SUTURE EHLN 3-0 FS-10 30 BLK (SUTURE) ×4 IMPLANT
SWABSTK COMLB BENZOIN TINCTURE (MISCELLANEOUS) ×4 IMPLANT
SYR 3ML LL SCALE MARK (SYRINGE) ×4 IMPLANT
SYR CONTROL 10ML (SYRINGE) ×8 IMPLANT
SYRINGE 10CC LL (SYRINGE) ×4 IMPLANT

## 2015-10-14 NOTE — Progress Notes (Signed)
zofran given for nausea  

## 2015-10-14 NOTE — Discharge Instructions (Signed)

## 2015-10-14 NOTE — Telephone Encounter (Signed)
DR BYRNETT IS IT OK PTS P/O IS ON 10-26-15? YOU HAD REQUESTED A 10 DAY P/O VISIT FOR PT.YOU ARE IN ON THE 5TH DAY OF P/O  10-19-15 & NOT BACK IN OFC UNTIL 10-26-15 WHICH IS THE 12TH DAY FROM P/O DATE 10-17-15.

## 2015-10-14 NOTE — Anesthesia Preprocedure Evaluation (Addendum)
Anesthesia Evaluation  Patient identified by MRN, date of birth, ID band Patient awake    Reviewed: Allergy & Precautions, NPO status , Patient's Chart, lab work & pertinent test results, reviewed documented beta blocker date and time   Airway Mallampati: III  TM Distance: >3 FB     Dental  (+) Chipped   Pulmonary former smoker,           Cardiovascular      Neuro/Psych  Headaches,    GI/Hepatic   Endo/Other    Renal/GU      Musculoskeletal   Abdominal   Peds  Hematology   Anesthesia Other Findings Obese. Can take Morphine OK.  Reproductive/Obstetrics                            Anesthesia Physical Anesthesia Plan  ASA: III  Anesthesia Plan: General   Post-op Pain Management:    Induction: Intravenous  Airway Management Planned: LMA  Additional Equipment:   Intra-op Plan:   Post-operative Plan:   Informed Consent: I have reviewed the patients History and Physical, chart, labs and discussed the procedure including the risks, benefits and alternatives for the proposed anesthesia with the patient or authorized representative who has indicated his/her understanding and acceptance.     Plan Discussed with: CRNA  Anesthesia Plan Comments:        Anesthesia Quick Evaluation

## 2015-10-14 NOTE — Transfer of Care (Signed)
Immediate Anesthesia Transfer of Care Note  Patient: Jared Gross  Procedure(s) Performed: Procedure(s): HERNIA REPAIR INGUINAL ADULT (Right) HYDROCELECTOMY ADULT (Left)  Patient Location: PACU  Anesthesia Type:General  Level of Consciousness: awake, alert  and oriented  Airway & Oxygen Therapy: Patient connected to face mask oxygen  Post-op Assessment: Post -op Vital signs reviewed and stable  Post vital signs: Reviewed and stable  Last Vitals:  Filed Vitals:   10/14/15 0607  BP: 131/93  Temp: 35.8 C  Resp: 16    Last Pain: There were no vitals filed for this visit.       Complications: No apparent anesthesia complications

## 2015-10-14 NOTE — Anesthesia Procedure Notes (Signed)
Procedure Name: LMA Insertion Date/Time: 10/14/2015 7:49 AM Performed by: Lorie Apley Pre-anesthesia Checklist: Patient identified, Emergency Drugs available, Suction available, Patient being monitored and Timeout performed Patient Re-evaluated:Patient Re-evaluated prior to inductionOxygen Delivery Method: Circle system utilized Preoxygenation: Pre-oxygenation with 100% oxygen Intubation Type: IV induction LMA Size: 5.0 Dental Injury: Teeth and Oropharynx as per pre-operative assessment

## 2015-10-14 NOTE — Progress Notes (Signed)
Ice pack to left scrtoum

## 2015-10-14 NOTE — H&P (Signed)
Urology Admission H&P  Chief Complaint: left scrotal swelling  History of Present Illness: [name] is a 50yo with left scrotal swelling who was found to have a left hydrocele and right inguinal hernia. 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  Past Medical History  Diagnosis Date  . Acid reflux   . Anxiety   . Hyperlipidemia    Past Surgical History  Procedure Laterality Date  . Nasal sinus surgery      Home Medications:  Prescriptions prior to admission  Medication Sig Dispense Refill Last Dose  . Aspirin-Salicylamide-Caffeine (ARTHRITIS STRENGTH BC POWDER PO) Take 1 packet by mouth daily.   "been about a week"   Allergies:  Allergies  Allergen Reactions  . Hydrocodone-Acetaminophen Nausea And Vomiting    Family History  Problem Relation Age of Onset  . Hematuria Neg Hx   . Kidney cancer Neg Hx    Social History:  reports that he quit smoking about 20 years ago. He does not have any smokeless tobacco history on file. He reports that he drinks alcohol. He reports that he does not use illicit drugs.  Review of Systems  All other systems reviewed and are negative.   Physical Exam:  Vital signs in last 24 hours: Temp:  [96.5 F (35.8 C)] 96.5 F (35.8 C) (06/23 0607) Resp:  [16] 16 (06/23 0607) BP: (131)/(93) 131/93 mmHg (06/23 0607) SpO2:  [98 %] 98 % (06/23 0607) Weight:  [114.306 kg (252 lb)] 114.306 kg (252 lb) (06/23 [id number]) Physical Exam  Constitutional: He appears well-developed and well-nourished.  HENT:  Head: Normocephalic and atraumatic.  Eyes: EOM are normal. Pupils are equal, round, and reactive to light.  Neck: Normal range of motion. No thyromegaly present.  Cardiovascular: Normal rate and regular rhythm.   Respiratory: Effort normal. No respiratory distress.  GI: Soft. He exhibits no distension and no mass. There is no tenderness. There is no rebound and no guarding. A  hernia is present. Hernia confirmed positive in the right inguinal area. Hernia confirmed negative in the left inguinal area.  Genitourinary: Penis normal. Left testis shows swelling. Circumcised.    Laboratory Data:  No results found for this or any previous visit (from the past 24 hour(s)). No results found for this or any previous visit (from the past 240 hour(s)). Creatinine: No results for input(s): CREATININE in the last 168 hours. Baseline Creatinine: unknwon  Impression/Assessment:  49yo with left hydrocele and right inguinal hernia  Plan:  The risks/benefits/alternatives to left hydrocelectomy was explained to the patient and he understands and wishes to proceed with surgery  Nicolette Bang 10/14/2015, 7:30 AM

## 2015-10-14 NOTE — H&P (Signed)
No change in clinical history or exam. Patient shaved himself (without instruction).  For right inguinal hernia repair and left hydrocele removal.

## 2015-10-14 NOTE — Op Note (Signed)
Preoperative diagnosis: Right inguinal hernia. Left scrotal hydrocele.  Postoperative diagnosis: Same.  Operative procedure: Right direct inguinal hernia repair with Bard PerFix plug  Operating surgeon: Ollen Bowl, M.D.  Anesthesia: Gen. by LMA.0.5% Marcaine with 1-200,000 epinephrine, 30 mL total.   Estimated blood loss: 5 mL.  Clinical note: This 49 year old male has developed a symptomatic right inguinal hernia as well as a left scrotal hydrocele. He is admitted for planned elective hernia repair and scrotal hydrocele excision (the latter by urology) sees.  Operative note: The patient had done some of his skin prep at home with a razor. This was finalized by clippers in the preop holding area. He received Kefzol intravenously prior to procedure. The lower abdomen and penis and scrotum were prepped with Betadine scrub and solution and draped. A 5 cm skin line incision was made after field block anesthesia was established with Marcaine with epinephrine. Hemostasis was with electrocautery and 3-0 Vicryl ties. The external plicas exposed and opened in the direction of its fibers. The very thick muscular component of the cord was divided with cautery. The vas and vessels were exposed. Dissection at the level of the internal ring showed no evidence of an indirect defect. There was a 1.2 cm direct defect. The perineum was opened and the preperitoneal space exposed. A large PerFix plug was placed in position and anchored to the ileopubic tract with a 0 Surgilon figure-of-eight sutur The onlay mesh was then anchored to the pubic tuand the inguinal ligament with interrupted 0 Surgilon sutures. The medial and superior borders were anchored to the transverse abdominis aponeurosis in a similar fashion. The lateral slit was closed and toward all placed in the wound for postoperative analgesia. The external plicas closed with running 2-0 Vicryl, Scarpa's fascia closed with running 3-0 Vicryl and the skin  closed with a running 4-0 Vicryl septic suture. Benzoin, Steri-Strips, Telfa and Tegaderm dressing was applied.  The procedure was at this time turned over to Dr. Alyson Ingles for hydrocelectomy.

## 2015-10-14 NOTE — Op Note (Signed)
Preoperative diagnosis: Left hydrocele  Postoperative diagnosis: left hydrocele  Procedure: 1. Excision of left appendix testis 2. Left hydrocelectomy  Attending: Nicolette Bang, MD  Anesthesia: General  History of blood loss: Minimal  Antibiotics: ancef  Drains: none  Specimens: 1. Left appendix testis  Findings: 5cm left hydrocele. Small spermatic cord lipoma  Indications: Patient is a 50 year old male with a history of left hydrocele that was growing in size and causing him pain with walking.  We discussed the treatment options including observation versus excision after discussing treatment options he and his mother decided to proceed with excision.   Procedure in detail: Prior to procedure consent was obtained.  Patient was brought to the operating room and a brief timeout was done to ensure correct patient, correct procedure, correct site.  General anesthesia was administered and patient was placed in supine position.  His genitalia was then prepped and draped in usual sterile fashion.  A 4 cm incision was made in the left hemiscrotum.  We dissected down to the tunica and then incised the tunica. A 5cm hydrocele was encountered. We then excised the hydrocele sac and then over sewed the edge with 2-0 Vicryl in a running fashion. We then excised the left appendix testis and sent it for pathology. We then placed a 1/4" penrose drain through a separate incision in the lower left hemiscrotum. The drain was swen in place with a 3-0 nylon. We then returned the testis to the left hemiscrotum and closed the overlying dartos with 3-0 vicryl in a running fashion. The skin was then closed with 4-0 monocryl in a running fashion. Dermabond was placed on the incision.  A dressing was then applied to the incision.  We then placed a scrotal fluff and this then concluded the procedure which was well tolerated by the patient.  Complications: None  Condition: Stable, extubated, transferred to  PACU.  Plan: Patient is to be discharged home.  He is to follow up in 1 week for drain removal

## 2015-10-14 NOTE — Anesthesia Postprocedure Evaluation (Signed)
Anesthesia Post Note  Patient: Jared Gross  Procedure(s) Performed: Procedure(s) (LRB): HERNIA REPAIR INGUINAL ADULT (Right) HYDROCELECTOMY ADULT (Left)  Patient location during evaluation: PACU Anesthesia Type: General Level of consciousness: awake and alert Pain management: pain level controlled Vital Signs Assessment: post-procedure vital signs reviewed and stable Respiratory status: spontaneous breathing, nonlabored ventilation, respiratory function stable and patient connected to nasal cannula oxygen Cardiovascular status: blood pressure returned to baseline and stable Postop Assessment: no signs of nausea or vomiting Anesthetic complications: no    Last Vitals:  Filed Vitals:   10/14/15 1120 10/14/15 1124  BP: 120/65 115/68  Pulse: 72 69  Temp:    Resp: 14 16    Last Pain:  Filed Vitals:   10/14/15 1125  PainSc: 2                  Corda Shutt S

## 2015-10-17 ENCOUNTER — Telehealth: Payer: Self-pay

## 2015-10-17 DIAGNOSIS — N433 Hydrocele, unspecified: Secondary | ICD-10-CM

## 2015-10-17 NOTE — Telephone Encounter (Signed)
Pt wife called stating he has been taking 2 percocet every 4hrs and his pain is not controlled. Wife stated pt describes pain to be 9/10 pain scale. Wife requested more pain medication. Please advise.

## 2015-10-18 MED ORDER — OXYCODONE-ACETAMINOPHEN 5-325 MG PO TABS: 1.0000 | ORAL_TABLET | ORAL | Status: DC | PRN

## 2015-10-18 NOTE — Telephone Encounter (Signed)
[facility] Per Dr. Alyson Ingles pt can have percocet 5/325 #30. Script printed and left up front.

## 2015-10-18 NOTE — Telephone Encounter (Signed)
That will be fine. 

## 2015-10-24 ENCOUNTER — Ambulatory Visit (INDEPENDENT_AMBULATORY_CARE_PROVIDER_SITE_OTHER): Payer: BLUE CROSS/BLUE SHIELD | Admitting: Urology

## 2015-10-24 VITALS — BP 128/75 | HR 75 | Ht 74.0 in | Wt 248.0 lb

## 2015-10-24 DIAGNOSIS — N432 Other hydrocele: Secondary | ICD-10-CM

## 2015-10-24 NOTE — Progress Notes (Signed)
10/24/2015 9:34 AM   Jared Gross 08-26-1965 [id number]  Referring provider: Sherrin Daisy, MD Jared Gross, Jared Gross [id number]  Chief Complaint  Patient presents with  . Routine Post Op    HPI: The patient is a 50 year old gentleman who is 10 days status post left hydrocelectomy. He has done well since his surgery. His drain did become dislodged last week. He was instructed on how to remove the suture and remove the drain over the phone. He's had no promises that time. He is more sore from his right inguinal hernia repair.   PMH: Past Medical History  Diagnosis Date  . Acid reflux   . Anxiety   . Hyperlipidemia     Surgical History: Past Surgical History  Procedure Laterality Date  . Nasal sinus surgery    . Inguinal hernia repair Right 10/14/2015    Procedure: HERNIA REPAIR INGUINAL ADULT;  Surgeon: Jared Bellow, MD;  Location: [facility] ORS;  Service: General;  Laterality: Right;  . Hydrocele excision Left 10/14/2015    Procedure: HYDROCELECTOMY ADULT;  Surgeon: Jared Gustin, MD;  Location: [facility] ORS;  Service: Urology;  Laterality: Left;    Home Medications:    Medication List       This list is accurate as of: 10/24/15  9:34 AM.  Always use your most recent med list.               oxyCODONE-acetaminophen 5-325 MG tablet  Commonly known as:  ROXICET  Take 1-2 tablets by mouth every 4 (four) hours as needed for moderate pain or severe pain.     promethazine 12.5 MG tablet  Commonly known as:  PHENERGAN  Take 1 tablet (12.5 mg total) by mouth every 4 (four) hours as needed for nausea or vomiting (Use if narcotic causes nausea.).        Allergies:  Allergies  Allergen Reactions  . Hydrocodone-Acetaminophen Nausea And Vomiting    Family History: Family History  Problem Relation Age of Onset  . Hematuria Neg Hx   . Kidney cancer Neg Hx     Social History:  reports that he quit smoking about 20 years ago. He does not have any  smokeless tobacco history on file. He reports that he drinks alcohol. He reports that he does not use illicit drugs.  ROS:                                        Physical Exam: BP 128/75 mmHg  Pulse 75  Ht 6\' 2"  (1.88 m)  Wt 248 lb (112.492 kg)  BMI 31.83 kg/m2  Constitutional:  Alert and oriented, No acute distress. HEENT: [place] AT, moist mucus membranes.  Trachea midline, no masses. Cardiovascular: No clubbing, cyanosis, or edema. Respiratory: Normal respiratory effort, no increased work of breathing. GI: Abdomen is soft, nontender, nondistended, no abdominal masses GU: No CVA tenderness. Scrotal incision clean dry and intact. No drainage. Mild postoperative edema that is consistent with recent surgery. No sign of infection. Skin: No rashes, bruises or suspicious lesions. Lymph: No cervical or inguinal adenopathy. Neurologic: Grossly intact, no focal deficits, moving all 4 extremities. Psychiatric: Normal mood and affect.  Laboratory Data: No results found for: WBC, HGB, HCT, MCV, PLT  No results found for: CREATININE  No results found for: PSA  No results found for: TESTOSTERONE  No results found for: HGBA1C  Urinalysis  Component Value Date/Time   APPEARANCEUR Clear 08/19/2015 1100   GLUCOSEU Negative 08/19/2015 1100   BILIRUBINUR Negative 08/19/2015 1100   PROTEINUR Negative 08/19/2015 1100   NITRITE Negative 08/19/2015 1100   LEUKOCYTESUR Negative 08/19/2015 1100     Assessment & Plan:    1. Left hydrocele status post left hydrocelectomy Doing well post op. Follow-up as needed.   Jared Retort, MD  [facility] [address], [place], [place] [address] [phone]

## 2015-10-26 ENCOUNTER — Ambulatory Visit (INDEPENDENT_AMBULATORY_CARE_PROVIDER_SITE_OTHER): Payer: BLUE CROSS/BLUE SHIELD | Admitting: General Surgery

## 2015-10-26 ENCOUNTER — Encounter: Payer: Self-pay | Admitting: General Surgery

## 2015-10-26 VITALS — BP 114/72 | HR 68 | Resp 14 | Ht 74.0 in | Wt 248.0 lb

## 2015-10-26 DIAGNOSIS — K4090 Unilateral inguinal hernia, without obstruction or gangrene, not specified as recurrent: Secondary | ICD-10-CM

## 2015-10-26 NOTE — Patient Instructions (Addendum)
The patient is aware to call back for any questions or concerns. Take care with lifting. You may drive when you are pain free.

## 2015-10-26 NOTE — Progress Notes (Signed)
Patient ID: Jared Gross, male   DOB: Feb 27, 1966, 50 y.o.   MRN: [id number]  Chief Complaint  Patient presents with  . Routine Post Op    HPI Jared Gross is a 50 y.o. male.  Here today for postoperative visit, hernia and hydrocele excision on 10-14-15. Drain came out June 30th. He states the area is still swollen but improving. He is still using the pain medication sparingly.  His bowels are moving with the help of Miralax.  The patient reports that he had some drainage after the scrotal drain spontaneously was extruded, but this is stopped. Most of his discomfort is from the right inguinal hernia repair.  HPI  Past Medical History  Diagnosis Date  . Acid reflux   . Anxiety   . Hyperlipidemia     Past Surgical History  Procedure Laterality Date  . Nasal sinus surgery    . Inguinal hernia repair Right 10/14/2015    Procedure: HERNIA REPAIR INGUINAL ADULT;  Surgeon: Robert Bellow, MD;  Location: [facility] ORS;  Service: General;  Laterality: Right;  . Hydrocele excision Left 10/14/2015    Procedure: HYDROCELECTOMY ADULT;  Surgeon: Cleon Gustin, MD;  Location: [facility] ORS;  Service: Urology;  Laterality: Left;  . Hernia repair Right October 14, 2015    Indirect defect repaired with large Bard Perfix plug and onlay mesh.     Family History  Problem Relation Age of Onset  . Hematuria Neg Hx   . Kidney cancer Neg Hx     Social History Social History  Substance Use Topics  . Smoking status: Former Smoker    Quit date: 07/22/1995  . Smokeless tobacco: Never Used  . Alcohol Use: 0.0 oz/week    0 Standard drinks or equivalent per week    Allergies  Allergen Reactions  . Hydrocodone-Acetaminophen Nausea And Vomiting    Current Outpatient Prescriptions  Medication Sig Dispense Refill  . oxyCODONE-acetaminophen (ROXICET) 5-325 MG tablet Take 1-2 tablets by mouth every 4 (four) hours as needed for moderate pain or severe pain. 30 tablet 0  . polyethylene glycol (MIRALAX /  GLYCOLAX) packet Take 17 g by mouth daily as needed.    . promethazine (PHENERGAN) 12.5 MG tablet Take 1 tablet (12.5 mg total) by mouth every 4 (four) hours as needed for nausea or vomiting (Use if narcotic causes nausea.). 30 tablet 0   No current facility-administered medications for this visit.    Review of Systems Review of Systems  Constitutional: Negative.   Respiratory: Negative.   Cardiovascular: Negative.     Blood pressure 114/72, pulse 68, resp. rate 14, height 6\' 2"  (1.88 m), weight 248 lb (112.492 kg).  Physical Exam Physical Exam  Constitutional: He is oriented to person, place, and time. He appears well-developed and well-nourished.  Genitourinary:     Neurological: He is alert and oriented to person, place, and time.  Skin: Skin is warm and dry.  Psychiatric: His behavior is normal.       Assessment    Doing well status post right indirect inguinal hernia repair.    Plan    The patient will increase his activity as tolerated. 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 Proper lifting technique reviewed.  We'll plan for follow-up examination in one month.     PCP:  Sherrin Daisy This information has been scribed by Karie Fetch RN, BSN,BC.   Robert Bellow 10/26/2015, 8:14 PM

## 2015-12-08 ENCOUNTER — Encounter: Payer: Self-pay | Admitting: General Surgery

## 2015-12-08 ENCOUNTER — Ambulatory Visit (INDEPENDENT_AMBULATORY_CARE_PROVIDER_SITE_OTHER): Payer: BLUE CROSS/BLUE SHIELD | Admitting: General Surgery

## 2015-12-08 VITALS — BP 132/82 | HR 66 | Resp 16 | Ht 74.0 in | Wt 249.0 lb

## 2015-12-08 DIAGNOSIS — K625 Hemorrhage of anus and rectum: Secondary | ICD-10-CM

## 2015-12-08 DIAGNOSIS — K4090 Unilateral inguinal hernia, without obstruction or gangrene, not specified as recurrent: Secondary | ICD-10-CM

## 2015-12-08 MED ORDER — POLYETHYLENE GLYCOL 3350 17 GM/SCOOP PO POWD: 1.0000 | Freq: Once | ORAL | 0 refills | Status: AC

## 2015-12-08 NOTE — Patient Instructions (Addendum)
The patient is aware to call back for any questions or concerns.  Colonoscopy A colonoscopy is an exam to look at the entire large intestine (colon). This exam can help find problems such as tumors, polyps, inflammation, and areas of bleeding. The exam takes about 1 hour.  LET [facility] CARE PROVIDER KNOW ABOUT:   Any allergies you have.  All medicines you are taking, including vitamins, herbs, eye drops, creams, and over-the-counter medicines.  Previous problems you or members of your family have had with the use of anesthetics.  Any blood disorders you have.  Previous surgeries you have had.  Medical conditions you have. RISKS AND COMPLICATIONS  Generally, this is a safe procedure. However, as with any procedure, complications can occur. Possible complications include:  Bleeding.  Tearing or rupture of the colon wall.  Reaction to medicines given during the exam.  Infection (rare). BEFORE THE PROCEDURE   Ask your health care provider about changing or stopping your regular medicines.  You may be prescribed an oral bowel prep. This involves drinking a large amount of medicated liquid, starting the day before your procedure. The liquid will cause you to have multiple loose stools until your stool is almost clear or light green. This cleans out your colon in preparation for the procedure.  Do not eat or drink anything else once you have started the bowel prep, unless your health care provider tells you it is safe to do so.  Arrange for someone to drive you home after the procedure. PROCEDURE   You will be given medicine to help you relax (sedative).  You will lie on your side with your knees bent.  A long, flexible tube with a light and camera on the end (colonoscope) will be inserted through the rectum and into the colon. The camera sends video back to a computer screen as it moves through the colon. The colonoscope also releases carbon dioxide gas to inflate the colon.  This helps your health care provider see the area better.  During the exam, your health care provider may take a small tissue sample (biopsy) to be examined under a microscope if any abnormalities are found.  The exam is finished when the entire colon has been viewed. AFTER THE PROCEDURE   Do not drive for 24 hours after the exam.  You may have a small amount of blood in your stool.  You may pass moderate amounts of gas and have mild abdominal cramping or bloating. This is caused by the gas used to inflate your colon during the exam.  Ask when your test results will be ready and how you will get your results. Make sure you get your test results.   This information is not intended to replace advice given to you by your health care provider. Make sure you discuss any questions you have with your health care provider.   Document Released: 04/06/2000 Document Revised: 01/28/2013 Document Reviewed: 12/15/2012 Elsevier Interactive Patient Education [employer].  The patient is scheduled for a Colonoscopy at [facility] on 01-18-16. They are aware to call the day before to get their arrival time. Miralax prescription has been sent into the patient's pharmacy. The patient is aware of date and instructions.

## 2015-12-08 NOTE — Progress Notes (Signed)
Patient ID: Jared Gross, male   DOB: November 05, 1965, 50 y.o.   MRN: [id number]  Chief Complaint  Patient presents with  . Routine Post Op    HPI Jared Gross is a 50 y.o. male.  Here today for postoperative hernia. He states he is doing well. Bowels are regular and he has seen small amount bright red bleeding On 2 occasions within the past month. He states it was after a hard constipated BM. He does admit to increased stress at home   HPI  Past Medical History:  Diagnosis Date  . Acid reflux   . Anxiety   . Hyperlipidemia     Past Surgical History:  Procedure Laterality Date  . HERNIA REPAIR Right October 14, 2015   Indirect defect repaired with large Bard Perfix plug and onlay mesh.   Marland Kitchen HYDROCELE EXCISION Left 10/14/2015   Procedure: HYDROCELECTOMY ADULT;  Surgeon: Cleon Gustin, MD;  Location: [facility] ORS;  Service: Urology;  Laterality: Left;  . INGUINAL HERNIA REPAIR Right 10/14/2015   Procedure: HERNIA REPAIR INGUINAL ADULT;  Surgeon: Robert Bellow, MD;  Location: [facility] ORS;  Service: General;  Laterality: Right;  . NASAL SINUS SURGERY      Family History  Problem Relation Age of Onset  . Hematuria Neg Hx   . Kidney cancer Neg Hx     Social History Social History  Substance Use Topics  . Smoking status: Former Smoker    Quit date: 07/22/1995  . Smokeless tobacco: Never Used  . Alcohol use 0.0 oz/week    Allergies  Allergen Reactions  . Hydrocodone-Acetaminophen Nausea And Vomiting    Current Outpatient Prescriptions  Medication Sig Dispense Refill  . Aspirin-Caffeine (BC FAST PAIN RELIEF PO) Take by mouth as needed.     No current facility-administered medications for this visit.     Review of Systems Review of Systems  Constitutional: Negative.   Respiratory: Negative.   Cardiovascular: Negative.     Blood pressure 132/82, pulse 66, resp. rate 16, height 6\' 2"  (1.88 m), weight 249 lb (112.9 kg).  Physical Exam Physical Exam  Constitutional: He  is oriented to person, place, and time. He appears well-developed and well-nourished.  Abdominal: Soft. Normal appearance.  Right inguinal hernia repair intact.  Genitourinary:     Neurological: He is alert and oriented to person, place, and time.  Skin: Skin is warm and dry.  Psychiatric: His behavior is normal.      Assessment    Doing well status post right inguinal hernia repair.  Residual swelling status post left scrotal exploration.  History passage of bright red blood per rectum, likely anorectal source.    Plan    Patient recently had a) developed colorectal cancer and is quite anxious about the possibility of a more serious process. He is within age range to consider screening colonoscopy and he would like to proceed in this regard.    Colonoscopy with possible biopsy/polypectomy prn: Information regarding the procedure, including its potential risks and complications (including but not limited to perforation of the bowel, which may require emergency surgery to repair, and bleeding) was verbally given to the patient. Educational information regarding lower intestinal endoscopy was given to the patient. Written instructions for how to complete the bowel prep using Miralax were provided. The importance of drinking ample fluids to avoid dehydration as a result of the prep emphasized.  The patient is scheduled for a Colonoscopy at [facility] on 01-18-16. They are aware to call the  day before to get their arrival time. Miralax prescription has been sent into the patient's pharmacy. The patient is aware of date and instructions.    This information has been scribed by Karie Fetch RN, BSN,BC.   Robert Bellow 12/09/2015, 7:26 PM

## 2015-12-09 DIAGNOSIS — K625 Hemorrhage of anus and rectum: Secondary | ICD-10-CM | POA: Insufficient documentation

## 2016-01-18 ENCOUNTER — Ambulatory Visit: Payer: BLUE CROSS/BLUE SHIELD | Admitting: Anesthesiology

## 2016-01-18 ENCOUNTER — Ambulatory Visit
Admission: RE | Admit: 2016-01-18 | Discharge: 2016-01-18 | Disposition: A | Discharge status: Home / Self Care | Payer: BLUE CROSS/BLUE SHIELD | Source: Ambulatory Visit | Attending: General Surgery | Admitting: General Surgery

## 2016-01-18 ENCOUNTER — Encounter
Admission: RE | Disposition: A | Discharge status: Home / Self Care | Payer: Self-pay | Source: Ambulatory Visit | Attending: General Surgery

## 2016-01-18 ENCOUNTER — Encounter: Payer: Self-pay | Admitting: Anesthesiology

## 2016-01-18 DIAGNOSIS — E669 Obesity, unspecified: Secondary | ICD-10-CM | POA: Insufficient documentation

## 2016-01-18 DIAGNOSIS — K4090 Unilateral inguinal hernia, without obstruction or gangrene, not specified as recurrent: Secondary | ICD-10-CM

## 2016-01-18 DIAGNOSIS — D125 Benign neoplasm of sigmoid colon: Secondary | ICD-10-CM | POA: Diagnosis not present

## 2016-01-18 DIAGNOSIS — Z6831 Body mass index (BMI) 31.0-31.9, adult: Secondary | ICD-10-CM | POA: Diagnosis not present

## 2016-01-18 DIAGNOSIS — K625 Hemorrhage of anus and rectum: Secondary | ICD-10-CM | POA: Diagnosis not present

## 2016-01-18 DIAGNOSIS — Z87891 Personal history of nicotine dependence: Secondary | ICD-10-CM | POA: Diagnosis not present

## 2016-01-18 DIAGNOSIS — Z7982 Long term (current) use of aspirin: Secondary | ICD-10-CM | POA: Diagnosis not present

## 2016-01-18 DIAGNOSIS — K635 Polyp of colon: Secondary | ICD-10-CM | POA: Diagnosis not present

## 2016-01-18 HISTORY — PX: COLONOSCOPY WITH PROPOFOL: SHX5780

## 2016-01-18 SURGERY — COLONOSCOPY WITH PROPOFOL
Anesthesia: General

## 2016-01-18 MED ORDER — SODIUM CHLORIDE 0.9 % IV SOLN
INTRAVENOUS | Status: DC
  Administered 2016-01-18: 1000 mL via INTRAVENOUS

## 2016-01-18 MED ORDER — PROPOFOL 10 MG/ML IV BOLUS
INTRAVENOUS | Status: DC | PRN
  Administered 2016-01-18: 50 mg via INTRAVENOUS
  Administered 2016-01-18: 20 mg via INTRAVENOUS
  Administered 2016-01-18: 30 mg via INTRAVENOUS
  Administered 2016-01-18 (×2): 50 mg via INTRAVENOUS
  Administered 2016-01-18: 20 mg via INTRAVENOUS
  Administered 2016-01-18: 30 mg via INTRAVENOUS
  Administered 2016-01-18 (×2): 50 mg via INTRAVENOUS

## 2016-01-18 MED ORDER — GLYCOPYRROLATE 0.2 MG/ML IJ SOLN
INTRAMUSCULAR | Status: DC | PRN
  Administered 2016-01-18: 0.1 mg via INTRAVENOUS

## 2016-01-18 MED ORDER — LIDOCAINE HCL (CARDIAC) 10 MG/ML IV SOLN
INTRAVENOUS | Status: DC | PRN
  Administered 2016-01-18: 50 mg via INTRAVENOUS

## 2016-01-18 NOTE — Op Note (Signed)
[facility] Gastroenterology Patient Name: Jared Gross Procedure Date: 01/18/2016 9:02 AM MRN: [id number] Account #: [id number] Date of Birth: 10-Aug-1965 Admit Type: Outpatient Age: 50 Room: [facility] ENDO ROOM 1 Gender: Male Note Status: Finalized Procedure:            Colonoscopy Indications:          Rectal bleeding Providers:            Robert Bellow, MD Referring MD:         Shirline Frees (Referring MD) Medicines:            Monitored Anesthesia Care Complications:        No immediate complications. Procedure:            Pre-Anesthesia Assessment:                       - Prior to the procedure, a History and Physical was                        performed, and patient medications, allergies and                        sensitivities were reviewed. The patient's tolerance of                        previous anesthesia was reviewed.                       - The risks and benefits of the procedure and the                        sedation options and risks were discussed with the                        patient. All questions were answered and informed                        consent was obtained.                       After obtaining informed consent, the colonoscope was                        passed under direct vision. Throughout the procedure,                        the patient's blood pressure, pulse, and oxygen                        saturations were monitored continuously. The                        Colonoscope was introduced through the anus and                        advanced to the the cecum, identified by appendiceal                        orifice and ileocecal valve. The colonoscopy was  performed without difficulty. The patient tolerated the                        procedure well. The quality of the bowel preparation                        was excellent. Findings:      Two sessile polyps were found in the sigmoid colon and distal  sigmoid       colon. The polyps were 10 mm in size. These polyps were removed with a       hot snare. Resection and retrieval were complete.      The retroflexed view of the distal rectum and anal verge was normal and       showed no anal or rectal abnormalities. Impression:           - Two 10 mm polyps in the sigmoid colon and in the                        distal sigmoid colon, removed with a hot snare.                        Resected and retrieved.                       - The distal rectum and anal verge are normal on                        retroflexion view. Recommendation:       - Telephone endoscopist for pathology results in 1 week. Procedure Code(s):    --- Professional ---                       [phone], Colonoscopy, flexible; with removal of tumor(s),                        polyp(s), or other lesion(s) by snare technique Diagnosis Code(s):    --- Professional ---                       D12.5, Benign neoplasm of sigmoid colon                       K62.5, Hemorrhage of anus and rectum CPT copyright 2016 American Medical Association. All rights reserved. The codes documented in this report are preliminary and upon coder review may  be revised to meet current compliance requirements. Robert Bellow, MD 01/18/2016 9:39:17 AM This report has been signed electronically. Number of Addenda: 0 Note Initiated On: 01/18/2016 9:02 AM Scope Withdrawal Time: 0 hours 12 minutes 54 seconds  Total Procedure Duration: 0 hours 16 minutes 57 seconds       [facility]

## 2016-01-18 NOTE — Anesthesia Postprocedure Evaluation (Signed)
Anesthesia Post Note  Patient: Jared Gross  Procedure(s) Performed: Procedure(s) (LRB): COLONOSCOPY WITH PROPOFOL (N/A)  Patient location during evaluation: PACU Anesthesia Type: General Level of consciousness: awake and alert and oriented Pain management: pain level controlled Vital Signs Assessment: post-procedure vital signs reviewed and stable Respiratory status: spontaneous breathing Cardiovascular status: blood pressure returned to baseline Anesthetic complications: no    Last Vitals:  Vitals:   01/18/16 0954 01/18/16 1014  BP: 102/73 106/72  Pulse:    Resp:    Temp:      Last Pain:  Vitals:   01/18/16 1014  TempSrc:   PainSc: 0-No pain                 Rachel Samples

## 2016-01-18 NOTE — H&P (Signed)
BARKON CLINK [id number] [birth year]     HPI: Small amount of bright red rectal bleeding.  For screening colonoscopy.   Prescriptions Prior to Admission  Medication Sig Dispense Refill Last Dose  . Aspirin-Caffeine (BC FAST PAIN RELIEF PO) Take by mouth as needed.   Past Week at Unknown time   Allergies  Allergen Reactions  . Hydrocodone-Acetaminophen Nausea And Vomiting   Past Medical History:  Diagnosis Date  . Acid reflux   . Anxiety   . Hyperlipidemia    Past Surgical History:  Procedure Laterality Date  . HERNIA REPAIR Right October 14, 2015   Indirect defect repaired with large Bard Perfix plug and onlay mesh.   Marland Kitchen HYDROCELE EXCISION Left 10/14/2015   Procedure: HYDROCELECTOMY ADULT;  Surgeon: Cleon Gustin, MD;  Location: [facility] ORS;  Service: Urology;  Laterality: Left;  . INGUINAL HERNIA REPAIR Right 10/14/2015   Procedure: HERNIA REPAIR INGUINAL ADULT;  Surgeon: Robert Bellow, MD;  Location: [facility] ORS;  Service: General;  Laterality: Right;  . NASAL SINUS SURGERY     Social History   Social History  . Marital status: Married    Spouse name: N/A  . Number of children: N/A  . Years of education: N/A   Occupational History  . Not on file.   Social History Main Topics  . Smoking status: Former Smoker    Quit date: 07/22/1995  . Smokeless tobacco: Never Used  . Alcohol use 0.0 oz/week  . Drug use: No  . Sexual activity: Not on file   Other Topics Concern  . Not on file   Social History Narrative  . No narrative on file   Social History   Social History Narrative  . No narrative on file     ROS: Negative.     PE: HEENT: Negative. Lungs: Clear. Cardio: RR.  Assessment/Plan:  Proceed with planned endoscopy.  Robert Bellow 01/18/2016   Assessment/Plan:  Proceed with planned endoscopy.

## 2016-01-18 NOTE — Transfer of Care (Signed)
Immediate Anesthesia Transfer of Care Note  Patient: Jared Gross  Procedure(s) Performed: Procedure(s): COLONOSCOPY WITH PROPOFOL (N/A)  Patient Location: PACU  Anesthesia Type:General  Level of Consciousness: awake, alert  and oriented  Airway & Oxygen Therapy: Patient Spontanous Breathing and Patient connected to nasal cannula oxygen  Post-op Assessment: Report given to RN and Post -op Vital signs reviewed and stable  Post vital signs: Reviewed and stable  Last Vitals:  Vitals:   01/18/16 0842 01/18/16 0944  BP: 131/76 102/86  Pulse: 62   Resp: 16 16  Temp: 36.4 C 36.3 C    Last Pain:  Vitals:   01/18/16 0944  TempSrc: Tympanic         Complications: No apparent anesthesia complications

## 2016-01-18 NOTE — Anesthesia Preprocedure Evaluation (Signed)
Anesthesia Evaluation  Patient identified by MRN, date of birth, ID band Patient awake    Reviewed: Allergy & Precautions, NPO status , Patient's Chart, lab work & pertinent test results, reviewed documented beta blocker date and time   Airway Mallampati: III  TM Distance: >3 FB     Dental  (+) Chipped   Pulmonary former smoker,    Pulmonary exam normal        Cardiovascular negative cardio ROS Normal cardiovascular exam     Neuro/Psych  Headaches, Anxiety    GI/Hepatic Neg liver ROS, GERD  Medicated,  Endo/Other  negative endocrine ROS  Renal/GU negative Renal ROS  negative genitourinary   Musculoskeletal negative musculoskeletal ROS (+)   Abdominal Normal abdominal exam  (+)   Peds negative pediatric ROS (+)  Hematology negative hematology ROS (+)   Anesthesia Other Findings Obese. Can take Morphine OK.  Reproductive/Obstetrics                             Anesthesia Physical  Anesthesia Plan  ASA: II  Anesthesia Plan: General   Post-op Pain Management:    Induction: Intravenous  Airway Management Planned: Nasal Cannula  Additional Equipment:   Intra-op Plan:   Post-operative Plan:   Informed Consent: I have reviewed the patients History and Physical, chart, labs and discussed the procedure including the risks, benefits and alternatives for the proposed anesthesia with the patient or authorized representative who has indicated his/her understanding and acceptance.   Dental advisory given  Plan Discussed with: CRNA and Surgeon  Anesthesia Plan Comments:         Anesthesia Quick Evaluation

## 2016-01-19 ENCOUNTER — Encounter: Payer: Self-pay | Admitting: General Surgery

## 2016-01-19 LAB — SURGICAL PATHOLOGY

## 2016-01-20 ENCOUNTER — Telehealth: Payer: Self-pay

## 2016-01-20 NOTE — Telephone Encounter (Signed)
Notified patient as instructed, patient pleased. Discussed follow-up appointments, patient agrees. Patient placed in recalls.   

## 2016-01-20 NOTE — Telephone Encounter (Signed)
-----   Message from Robert Bellow, MD sent at 01/19/2016  8:57 PM EDT ----- Please notify the patient that the pathology on the tissue removed was entirely benign. A follow-up exam in 10 years would be appropriate. ----- Message ----- From: Interface, Lab In Three Zero One Sent: 01/19/2016   1:55 PM To: Robert Bellow, MD

## 2017-11-19 IMAGING — US US EXTREM LOW*L* LIMITED
1 series · 13 of 13 positions shown · non-contrast
Comparison: None

CLINICAL DATA: Swelling of the left testicle for 1 week. Evaluate
for groin hernia.

EXAM:
ULTRASOUND PELVIS LIMITED
TECHNIQUE: Ultrasound examination of the LEFT GROIN was performed in the area
of clinical concern.

[Series 1: us extrem low*left* limited · 0.10mm/px · 13 acquisitions, 13 frames shown]
[im 1/13]
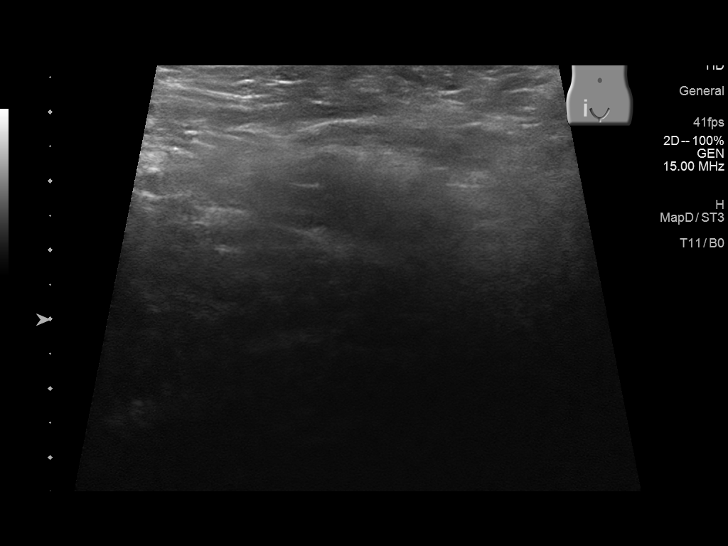
[im 2/13]
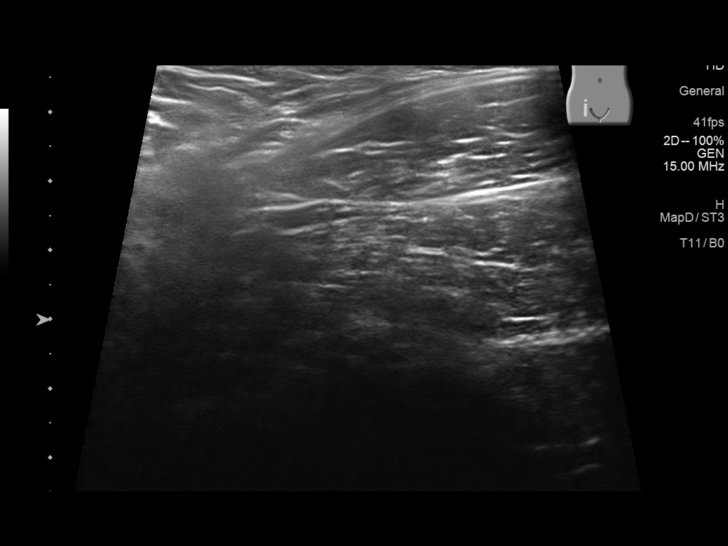
[im 3/13]
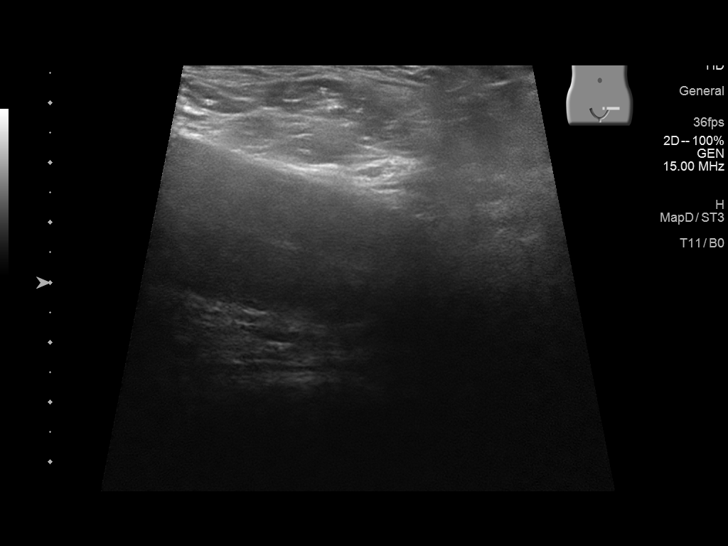
[im 4/13]
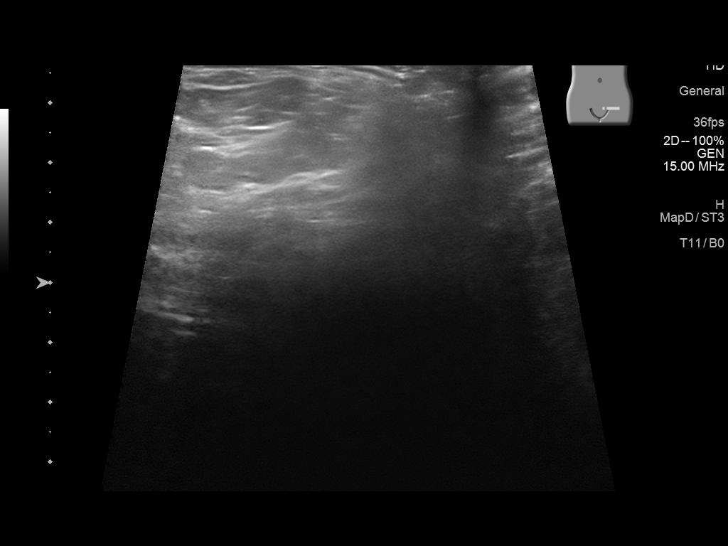
[im 5/13]
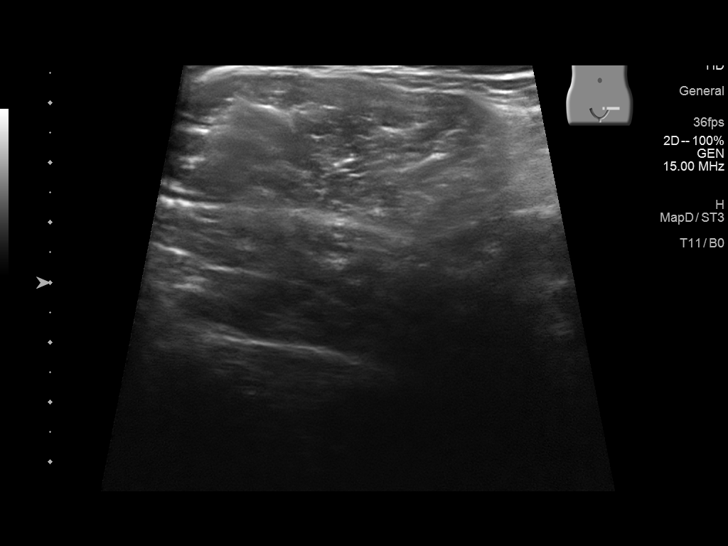
[im 6/13]
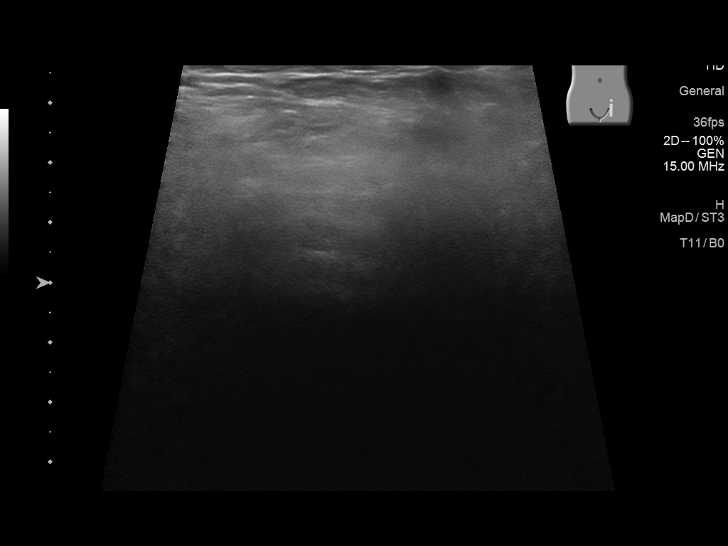
[im 7/13]
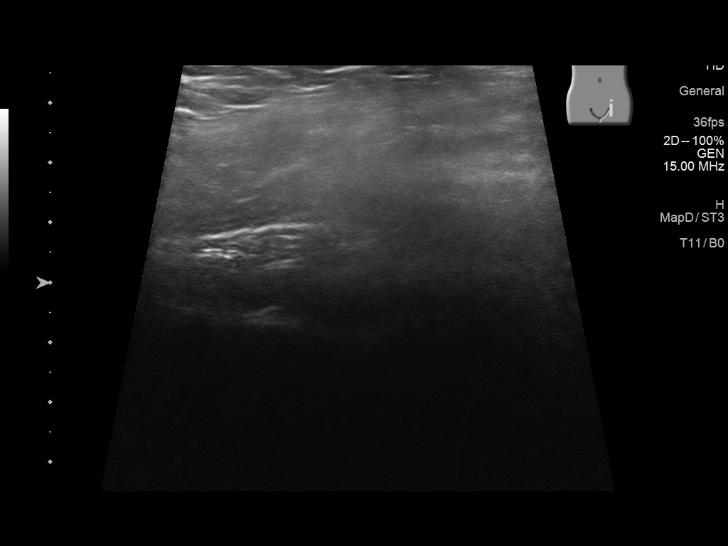
[im 8/13]
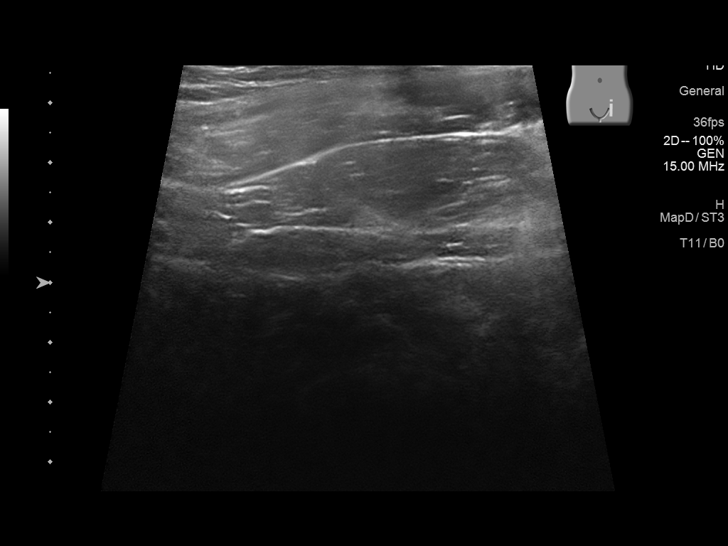
[im 9/13]
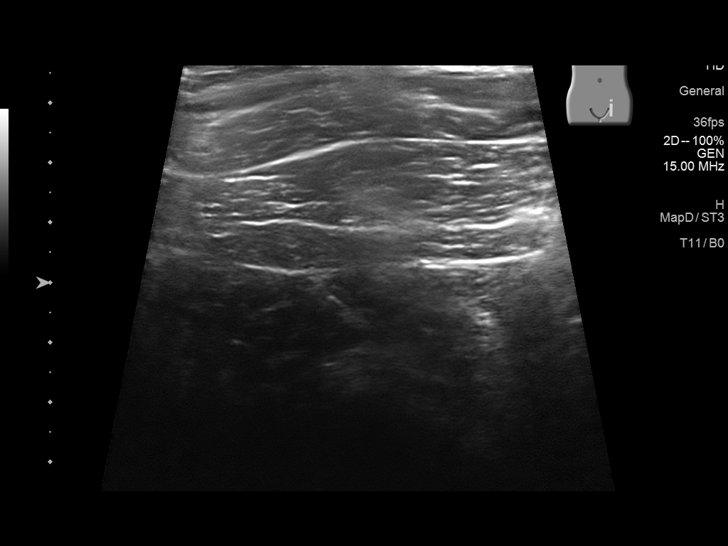
[im 10/13]
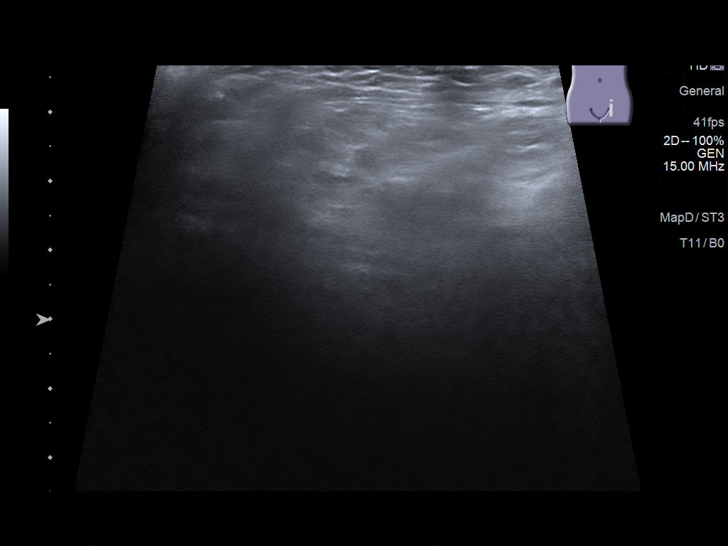
[im 11/13]
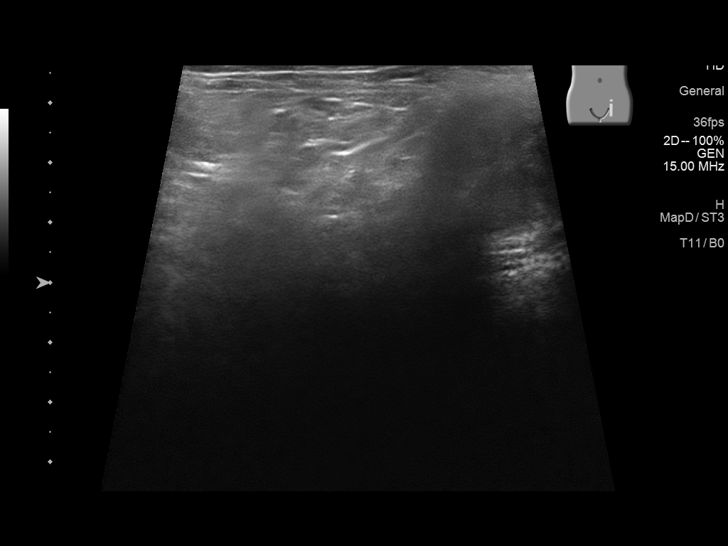
[im 12/13]
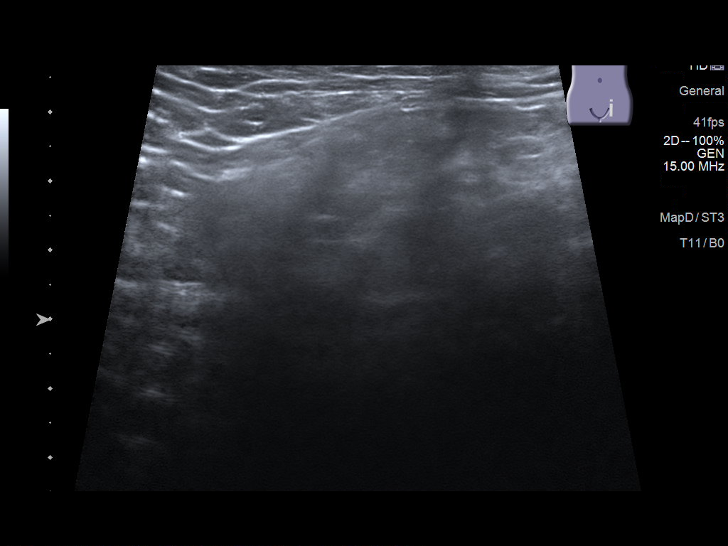
[im 13/13]
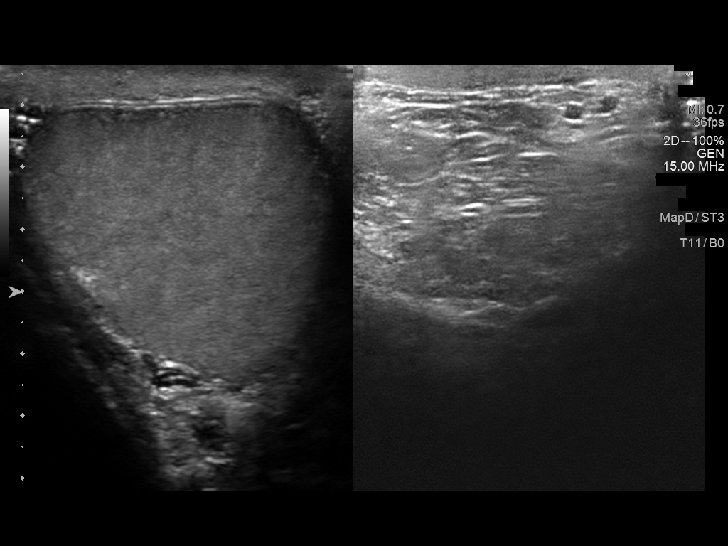

[13 of 13 positions shown; findings below may reference images not displayed]

FINDINGS: Left groin grayscale imaging with Valsalva shows mobile appearing
fat which could reflect a fatty inguinal hernia. No bowel seen in
the groin. Dedicated imaging of the scrotal contents reported
separately.
IMPRESSION: Mobile fat in the left groin may reflect fatty inguinal hernia. No
evidence of herniated bowel.
